# Patient Record
Sex: Male | Born: 1950 | Race: White | Hispanic: No | Marital: Married | Smoking: Never smoker
Health system: Southern US, Community
[De-identification: ages and names within clinical notes are randomized; demographics above are authoritative.]

## PROBLEM LIST (undated history)

## (undated) DIAGNOSIS — I10 Essential (primary) hypertension: Secondary | ICD-10-CM

## (undated) HISTORY — DX: Essential (primary) hypertension: I10

---

## 1998-08-14 HISTORY — PX: HERNIA REPAIR: SHX51

## 2004-08-14 HISTORY — PX: COLONOSCOPY: SHX174

## 2006-05-22 ENCOUNTER — Other Ambulatory Visit: Payer: Self-pay

## 2006-05-22 ENCOUNTER — Emergency Department: Payer: Self-pay

## 2008-04-05 IMAGING — CR DG CHEST 2V
1 series · 2 of 2 positions shown · non-contrast
Comparison: none

REASON FOR EXAM: LEFT arm numbness
COMMENTS:

PROCEDURE:     DXR - DXR CHEST PA (OR AP) AND LATERAL  - May 22, 2006  [DATE]
RESULT:          The lungs are clear.  The cardiac silhouette and visualized
bony skeleton are unremarkable.

[Series 1: view not recorded · 0.17mm/px · 2 of 2 slices shown]
[im 1/2]
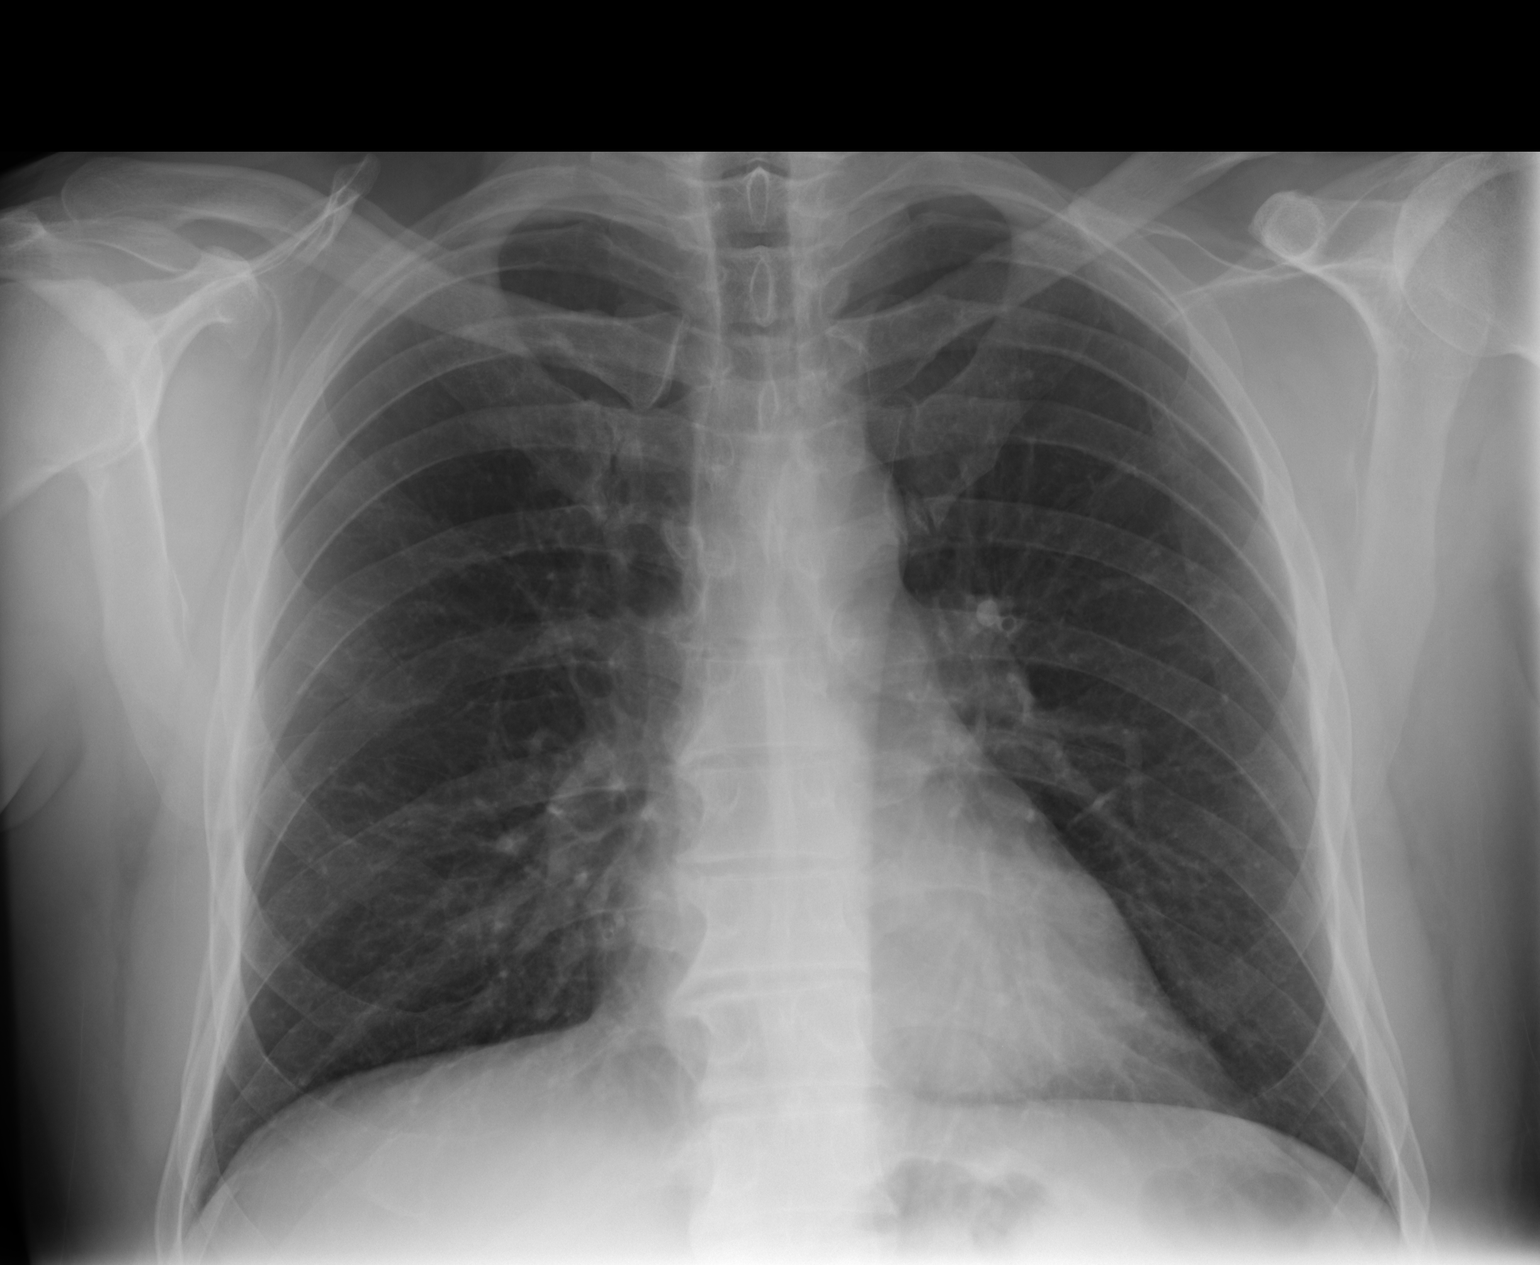
[im 2/2]
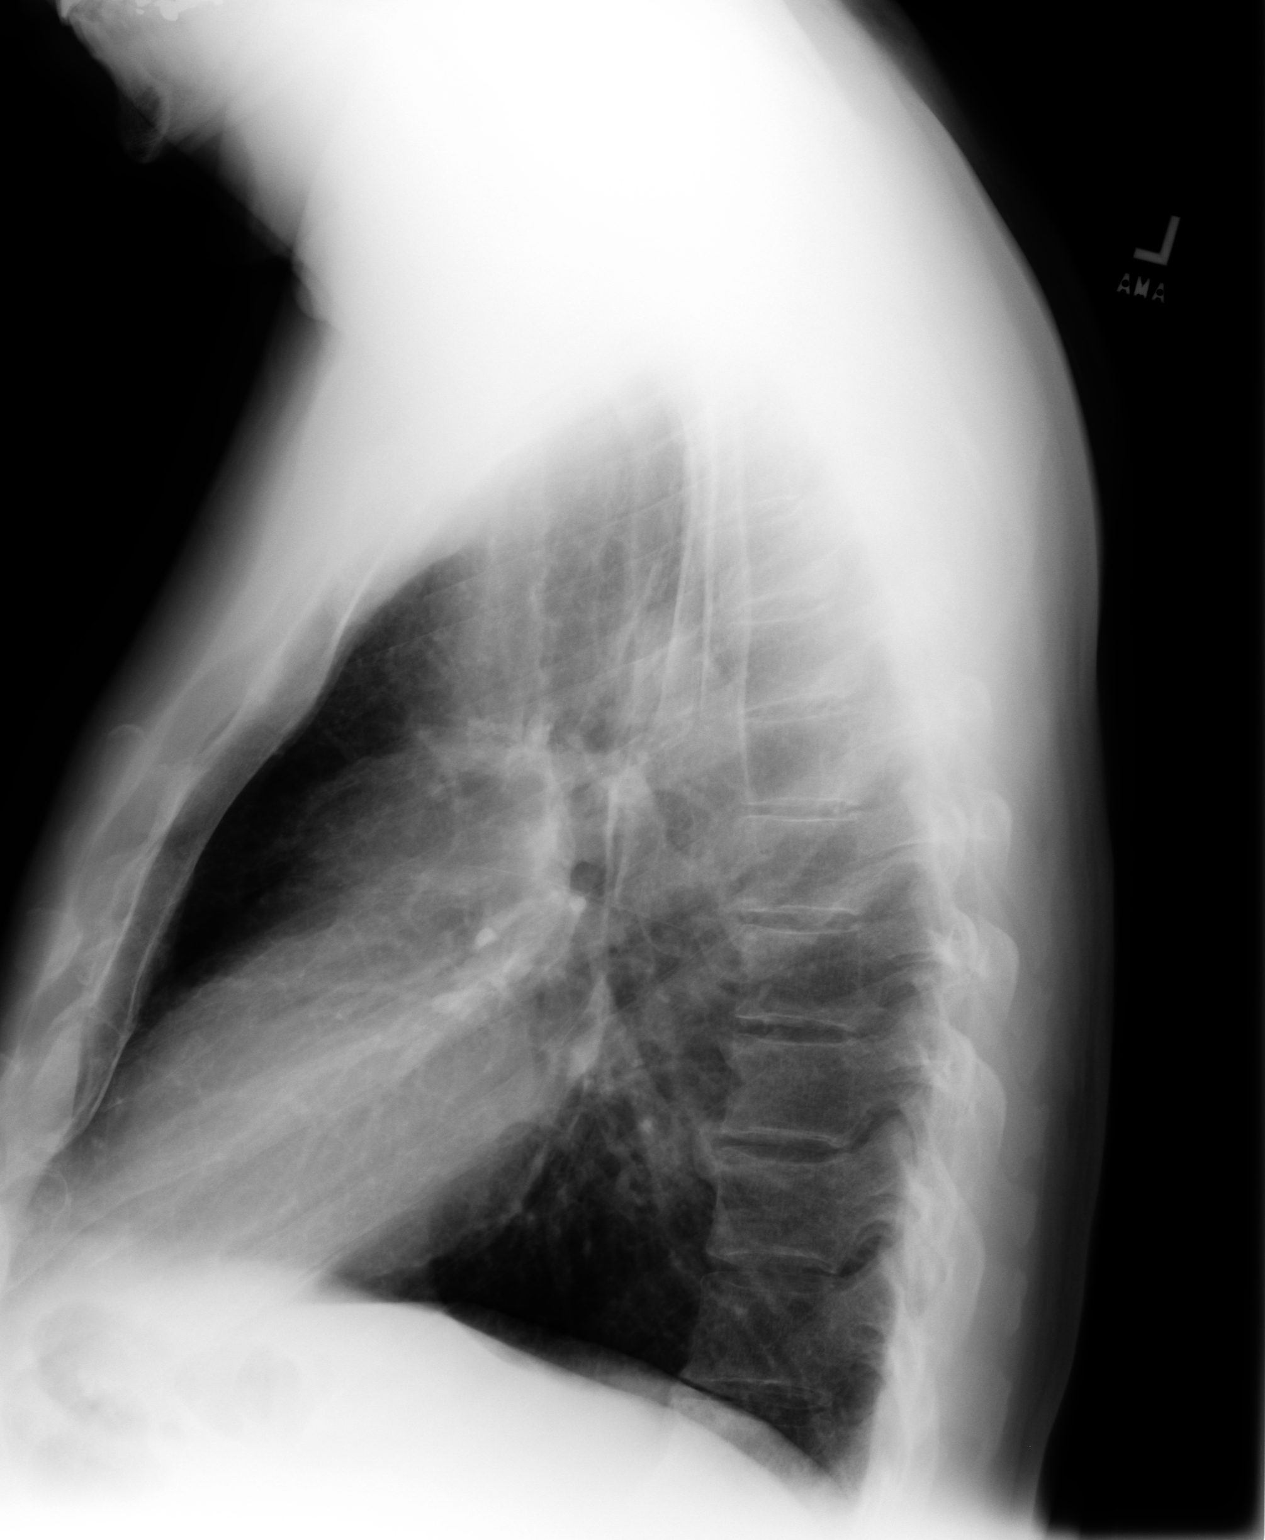

[2 of 2 positions shown; findings below may reference images not displayed]

IMPRESSION: Chest radiograph without evidence of acute cardiopulmonary
disease.

## 2014-02-18 ENCOUNTER — Encounter: Payer: Self-pay | Admitting: *Deleted

## 2014-03-10 ENCOUNTER — Ambulatory Visit (INDEPENDENT_AMBULATORY_CARE_PROVIDER_SITE_OTHER): Payer: BC Managed Care – PPO | Admitting: General Surgery

## 2014-03-10 ENCOUNTER — Encounter: Payer: Self-pay | Admitting: General Surgery

## 2014-03-10 VITALS — BP 180/70 | HR 72 | Resp 12 | Ht 74.0 in | Wt 212.0 lb

## 2014-03-10 DIAGNOSIS — K4090 Unilateral inguinal hernia, without obstruction or gangrene, not specified as recurrent: Secondary | ICD-10-CM

## 2014-03-10 NOTE — Patient Instructions (Addendum)

## 2014-03-10 NOTE — Progress Notes (Signed)
Patient ID: Dillon Coffey., male   DOB: July 19, 1951, 63 y.o.   MRN: [id number]  Chief Complaint  Patient presents with  . Other    left inguinal hernia    HPI Dillon Coffey. is a 63 y.o. male here today for a evaluation of a left inguinal hernia. Patient noticed the area 5 years ago .He states the area painful with active describes it as a dull ache. He had a laparoscopic right inguinal hernia repair done in 1995- 2000 in [place], [place].  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  HPI  Past Medical History  Diagnosis Date  . Hypertension     Past Surgical History  Procedure Laterality Date  . Hernia repair Right 2000    inguinal   . Colonoscopy  2006    No family history on file.  Social History History  Substance Use Topics  . Smoking status: Never Smoker   . Smokeless tobacco: Never Used  . Alcohol Use: No    No Known Allergies  Current Outpatient Prescriptions  Medication Sig Dispense Refill  . amLODipine (NORVASC) 5 MG tablet Take 1 tablet by mouth daily.      Marland Kitchen aspirin 81 MG chewable tablet Chew 81 mg by mouth daily.      Marland Kitchen co-enzyme Q-10 30 MG capsule Take 30 mg by mouth 3 (three) times daily.      . Fish Oil-Cholecalciferol (FISH OIL + D3 PO) Take 1 tablet by mouth daily.      Marland Kitchen lisinopril (PRINIVIL,ZESTRIL) 40 MG tablet Take 1 tablet by mouth daily.       No current facility-administered medications for this visit.    Review of Systems Review of Systems  Constitutional: Negative.   Respiratory: Negative.   Cardiovascular: Negative.   Genitourinary: Negative for decreased urine volume and difficulty urinating.    Blood pressure 180/70, pulse 72, resp. rate 12, height 6\' 2"  (1.88 m), weight 212 lb (96.163 kg).  Physical Exam Physical Exam  Constitutional: He is oriented to person, place, and time. He appears well-developed and well-nourished.  Eyes: Conjunctivae are normal. No scleral icterus.   Neck: Neck supple.  Cardiovascular: Normal rate, regular rhythm and normal heart sounds.   Pulmonary/Chest: Effort normal and breath sounds normal.  Abdominal: Soft. Normal appearance and bowel sounds are normal. There is no hepatomegaly. There is no tenderness. A hernia is present. Hernia confirmed positive in the left inguinal area. Hernia confirmed negative in the right inguinal area.  The hernia is evident in the supine position and reducible with mild pressure.  Genitourinary: Testes normal.  Neurological: He is alert and oriented to person, place, and time.  Skin: Skin is warm and dry.    Data Reviewed PCP notes dated February 18, 2014.  Laboratory studies dated November 21, 2013 sure what blood cell count 6900, hemoglobin 14.6, MCV of 86, platelet count 239,000, normal comprehensive metabolic panel with the exception of a mild solution of the blood sugar 118. Hemoglobin A1c 6.0.  Assessment    Left inguinal hernia.     Plan    Indications for elective repair were reviewed. The patient remains very active, and a mesh repair would be most appropriate. My preference for an anterior approach was discussed and was acceptable to the patient. Anticipated restrictions of activity were reviewed.    Patient is scheduled for surgery at [facility] on 03/24/14. He will pre admit by phone on 03/10/14. Patient  is aware of date and all instructions.  PCP. PCP: Dillon Coffey   Ref.  Dillon Coffey   Dillon Coffey 03/11/2014, 7:07 AM   a

## 2014-03-11 ENCOUNTER — Other Ambulatory Visit: Payer: Self-pay | Admitting: General Surgery

## 2014-03-11 DIAGNOSIS — K4090 Unilateral inguinal hernia, without obstruction or gangrene, not specified as recurrent: Secondary | ICD-10-CM | POA: Insufficient documentation

## 2014-03-24 ENCOUNTER — Ambulatory Visit: Payer: Self-pay | Admitting: General Surgery

## 2014-03-24 DIAGNOSIS — K4090 Unilateral inguinal hernia, without obstruction or gangrene, not specified as recurrent: Secondary | ICD-10-CM

## 2014-03-24 HISTORY — PX: HERNIA REPAIR: SHX51

## 2014-03-25 ENCOUNTER — Encounter: Payer: Self-pay | Admitting: General Surgery

## 2014-04-02 ENCOUNTER — Encounter: Payer: Self-pay | Admitting: General Surgery

## 2014-04-02 ENCOUNTER — Ambulatory Visit (INDEPENDENT_AMBULATORY_CARE_PROVIDER_SITE_OTHER): Payer: Self-pay | Admitting: General Surgery

## 2014-04-02 VITALS — BP 160/68 | HR 80 | Resp 14 | Ht 74.0 in | Wt 211.0 lb

## 2014-04-02 DIAGNOSIS — K4090 Unilateral inguinal hernia, without obstruction or gangrene, not specified as recurrent: Secondary | ICD-10-CM

## 2014-04-02 NOTE — Patient Instructions (Addendum)
Proper lifting techniques reviewed.Patient to return in three weeks.

## 2014-04-02 NOTE — Progress Notes (Signed)
Patient ID: Dillon Wolf., male   DOB: 08-30-1950, 63 y.o.   MRN: [id number]  Chief Complaint  Patient presents with  . Routine Post Op    left inguinal hernia    HPI Dillon Coffey. is a 63 y.o. male here today for his post op left inguinal hernia done on 03/24/14. Patient states he is doing well. The patient reports he required no narcotics post procedure. He is bored to tears at home. He is anxious to resume playing basketball. HPI  Past Medical History  Diagnosis Date  . Hypertension     Past Surgical History  Procedure Laterality Date  . Colonoscopy  2006  . Hernia repair Right 2000    inguinal   . Hernia repair Left 03/24/14    ingunial     No family history on file.  Social History History  Substance Use Topics  . Smoking status: Never Smoker   . Smokeless tobacco: Never Used  . Alcohol Use: No    No Known Allergies  Current Outpatient Prescriptions  Medication Sig Dispense Refill  . amLODipine (NORVASC) 5 MG tablet Take 1 tablet by mouth daily.      Marland Kitchen aspirin 81 MG chewable tablet Chew 81 mg by mouth daily.      Marland Kitchen co-enzyme Q-10 30 MG capsule Take 30 mg by mouth 3 (three) times daily.      . Fish Oil-Cholecalciferol (FISH OIL + D3 PO) Take 1 tablet by mouth daily.      Marland Kitchen lisinopril (PRINIVIL,ZESTRIL) 40 MG tablet Take 1 tablet by mouth daily.       No current facility-administered medications for this visit.    Review of Systems Review of Systems  Constitutional: Negative.   Respiratory: Negative.   Cardiovascular: Negative.     Blood pressure 160/68, pulse 80, resp. rate 14, height 6\' 2"  (1.88 m), weight 211 lb (95.709 kg).  Physical Exam Physical Exam  Constitutional: He is oriented to person, place, and time. He appears well-developed and well-nourished.  Abdominal:  Incision looks clean and healing well. A little bruising noticed  Neurological: He is alert and oriented to person, place, and time.  Skin: Skin is warm and dry.        Assessment    Doing well post left inguinal hernia repair.     Plan    The patient has been asked to refrain from basketball for a few more weeks. He may otherwise increase his activities as tolerated. We'll plan for follow up exam in 3 weeks.     PCP: Daryl Eastern 04/03/2014, 9:24 PM

## 2014-04-22 ENCOUNTER — Ambulatory Visit: Payer: BC Managed Care – PPO | Admitting: General Surgery

## 2014-06-10 ENCOUNTER — Encounter: Payer: Self-pay | Admitting: *Deleted

## 2014-12-05 NOTE — Op Note (Signed)
PATIENT NAME:  Dillon Coffey, Dillon Coffey MR#:  [id number] DATE OF BIRTH:  03-23-1951  DATE OF PROCEDURE:  03/24/2014  PREOPERATIVE DIAGNOSIS: Left inguinal hernia.   POSTOPERATIVE DIAGNOSIS: Left inguinal hernia.    OPERATIVE PROCEDURE:  Left inguinal hernia repair with large Ultrapro mesh.   SURGEON:  Robert Bellow, MD   ANESTHESIA: General by LMA under Dr. Kayleen Memos; Marcaine 0.5% with 1 to 200,000 units epinephrine, 30 mL local infiltration, Toradol 30 mg.   CLINICAL NOTE: This 64 year old male has a long-standing left inguinal hernia that has become increasingly symptomatic making it difficult for him to enjoy his 3 x per week basketball games. He is admitted for elective repair. He received Kefzol prior to the procedure. Hair was removed from the area with clippers.   OPERATIVE NOTE: With the patient under adequate general anesthesia, the groin was prepped with ChloraPrep, and draped. Field block anesthesia was established for postoperative analgesia with Marcaine. A 5 cm skin line incision along the anticipated course of the inguinal canal was carried down through the skin, and subcutaneous tissue with hemostasis achieved by electrocautery, and 3-0 Vicryl ties. The external oblique was opened in the direction of its fibers. The ilioinguinal nerve was identified, and protected. The patient was identified with a large lipoma of the cord that was scarred to the adjacent cord structures. This was taken down with cautery dissection. At the level of the internal ring, the lipoma was ligated with 3-0 Vicryl ties, excised, and discarded. The hernia sac which was indirect in nature was dissected free from adjacent cord structures, and freed into the preperitoneal space. Examination of the medial floor of the inguinal canal showed it to be intact.   A large Ultrapro mesh was smoothed into the preperitoneal space, and the external component laid along the floor of the inguinal canal. This was anchored to the  pubic tubercle with 0 Surgilon. The inferior border was anchored to the inguinal ligament with interrupted 0 Surgilon sutures, and the medial, and superior aspects were anchored to the transverse abdominis aponeurosis with interrupted 0 Surgilon sutures. A lateral slit was made for cord passage. Toradol was placed into the wound for postoperative analgesia. The ilioinguinal nerve was returned to its bed. The external oblique was closed with running 2-0 Vicryl. Scarpa fascia was closed with running 3-0 Vicryl, and the skin closed with a running 4-0 Vicryl subcuticular suture. Benzoin, Steri-Strips, Telfa and Tegaderm dressing was then applied.   The patient tolerated the procedure well and was taken to the recovery room in stable condition.    ____________________________ Robert Bellow, MD jwb:nt D: 03/24/2014 13:14:38 ET T: 03/24/2014 16:08:06 ET JOB#: [id number]  cc: Robert Bellow, MD, <Dictator> Angelina Ok, NP at [place] MD ELECTRONICALLY SIGNED 03/25/2014 7:09

## 2021-02-17 ENCOUNTER — Other Ambulatory Visit: Payer: Self-pay

## 2021-02-17 ENCOUNTER — Ambulatory Visit (INDEPENDENT_AMBULATORY_CARE_PROVIDER_SITE_OTHER): Payer: Medicare Other | Admitting: Dermatology

## 2021-02-17 DIAGNOSIS — D1800 Hemangioma unspecified site: Secondary | ICD-10-CM | POA: Diagnosis not present

## 2021-02-17 DIAGNOSIS — L578 Other skin changes due to chronic exposure to nonionizing radiation: Secondary | ICD-10-CM

## 2021-02-17 DIAGNOSIS — L820 Inflamed seborrheic keratosis: Secondary | ICD-10-CM

## 2021-02-17 DIAGNOSIS — L814 Other melanin hyperpigmentation: Secondary | ICD-10-CM

## 2021-02-17 NOTE — Patient Instructions (Addendum)
If you have any questions or concerns for your doctor, please call our main line at [phone] and press option 4 to reach your doctor's medical assistant. If no one answers, please leave a voicemail as directed and we will return your call as soon as possible. Messages left after 4 pm will be answered the following business day.   You may also send [place] a message via [place]. We typically respond to MyChart messages within 1-2 business days.  For prescription refills, please ask your pharmacy to contact our office. Our fax number is [phone].  If you have an urgent issue when the clinic is closed that cannot wait until the next business day, you can page your doctor at the number below.    Please note that while we do our best to be available for urgent issues outside of office hours, we are not available 24/7.   If you have an urgent issue and are unable to reach [place], you may choose to seek medical care at your doctor's office, retail clinic, urgent care center, or emergency room.  If you have a medical emergency, please immediately call 911 or go to the emergency department.  Pager Numbers  - Dr. Nehemiah Massed: [phone]  - Dr. Laurence Ferrari: [phone]  - Dr. Nicole Kindred: [phone]  In the event of inclement weather, please call our main line at [phone] for an update on the status of any delays or closures.  Dermatology Medication Tips: Please keep the boxes that topical medications come in in order to help keep track of the instructions about where and how to use these. Pharmacies typically print the medication instructions only on the boxes and not directly on the medication tubes.   If your medication is too expensive, please contact our office at [phone] option 4 or send [place] a message through [place].   We are unable to tell what your co-pay for medications will be in advance as this is different depending on your insurance coverage. However, we may be able to find a substitute  medication at lower cost or fill out paperwork to get insurance to cover a needed medication.   If a prior authorization is required to get your medication covered by your insurance company, please allow [place] 1-2 business days to complete this process.  Drug prices often vary depending on where the prescription is filled and some pharmacies may offer cheaper prices.  The website www.goodrx.com contains coupons for medications through different pharmacies. The prices here do not account for what the cost may be with help from insurance (it may be cheaper with your insurance), but the website can give you the price if you did not use any insurance.  - You can print the associated coupon and take it with your prescription to the pharmacy.  - You may also stop by our office during regular business hours and pick up a GoodRx coupon card.  - If you need your prescription sent electronically to a different pharmacy, notify our office through [facility] or by phone at [phone] option 4.   Cryotherapy Aftercare  Wash gently with soap and water everyday.   Apply Vaseline and Band-Aid daily until healed.    Seborrheic Keratosis  What causes seborrheic keratoses? Seborrheic keratoses are harmless, common skin growths that first appear during adult life.  As time goes by, more growths appear.  Some people may develop a large number of them.  Seborrheic keratoses appear on both covered and uncovered body parts.  They are  not caused by sunlight.  The tendency to develop seborrheic keratoses can be inherited.  They vary in color from skin-colored to gray, brown, or even black.  They can be either smooth or have a rough, warty surface.   Seborrheic keratoses are superficial and look as if they were stuck on the skin.  Under the microscope this type of keratosis looks like layers upon layers of skin.  That is why at times the top layer may seem to fall off, but the rest of the growth remains and  re-grows.    Treatment Seborrheic keratoses do not need to be treated, but can easily be removed in the office.  Seborrheic keratoses often cause symptoms when they rub on clothing or jewelry.  Lesions can be in the way of shaving.  If they become inflamed, they can cause itching, soreness, or burning.  Removal of a seborrheic keratosis can be accomplished by freezing, burning, or surgery. If any spot bleeds, scabs, or grows rapidly, please return to have it checked, as these can be an indication of a skin cancer.

## 2021-02-17 NOTE — Progress Notes (Signed)
   New Patient Visit  Subjective  Dillon Coffey. is a 70 y.o. male who presents for the following: check spot (L infra ocular, ~10 yrs, may have grown over years). Has pictures where it is much smaller.   The following portions of the chart were reviewed this encounter and updated as appropriate:        Review of Systems:  No other skin or systemic complaints except as noted in HPI or Assessment and Plan.  Objective  Well appearing patient in no apparent distress; mood and affect are within normal limits.  A focused examination was performed including face, arms. Relevant physical exam findings are noted in the Assessment and Plan.  L infraocular x 1, Total = 1 8.33mm waxy tan pap   Assessment & Plan   Hemangiomas - Red papules - Discussed benign nature - Observe - Call for any changes  Actinic Damage - chronic, secondary to cumulative UV radiation exposure/sun exposure over time - diffuse scaly erythematous macules with underlying dyspigmentation - Recommend daily broad spectrum sunscreen SPF 30+ to sun-exposed areas, reapply every 2 hours as needed.  - Recommend staying in the shade or wearing long sleeves, sun glasses (UVA+UVB protection) and wide brim hats (4-inch brim around the entire circumference of the hat). - Call for new or changing lesions.  Lentigines - Scattered tan macules - Due to sun exposure - Benign-appering, observe - Recommend daily broad spectrum sunscreen SPF 30+ to sun-exposed areas, reapply every 2 hours as needed. - Call for any changes  Inflamed seborrheic keratosis L infraocular x 1, Total = 1  Destruction of lesion - L infraocular x 1, Total = 1  Destruction method: cryotherapy   Informed consent: discussed and consent obtained   Lesion destroyed using liquid nitrogen: Yes   Region frozen until ice ball extended beyond lesion: Yes   Outcome: patient tolerated procedure well with no complications   Post-procedure details: wound care  instructions given   Additional details:  Prior to procedure, discussed risks of blister formation, small wound, skin dyspigmentation, or rare scar following cryotherapy. Recommend Vaseline ointment to treated areas while healing.   Return in about 2 months (around 04/20/2021) for ISK f/u.  I, Othelia Pulling, RMA, am acting as scribe for Brendolyn Patty, MD .  Documentation: I have reviewed the above documentation for accuracy and completeness, and I agree with the above.  Brendolyn Patty MD

## 2021-04-13 ENCOUNTER — Ambulatory Visit (INDEPENDENT_AMBULATORY_CARE_PROVIDER_SITE_OTHER): Payer: Medicare Other | Admitting: Dermatology

## 2021-04-13 ENCOUNTER — Other Ambulatory Visit: Payer: Self-pay

## 2021-04-13 DIAGNOSIS — L570 Actinic keratosis: Secondary | ICD-10-CM

## 2021-04-13 DIAGNOSIS — D1800 Hemangioma unspecified site: Secondary | ICD-10-CM

## 2021-04-13 DIAGNOSIS — Z1283 Encounter for screening for malignant neoplasm of skin: Secondary | ICD-10-CM | POA: Diagnosis not present

## 2021-04-13 DIAGNOSIS — D2239 Melanocytic nevi of other parts of face: Secondary | ICD-10-CM | POA: Diagnosis not present

## 2021-04-13 DIAGNOSIS — L821 Other seborrheic keratosis: Secondary | ICD-10-CM

## 2021-04-13 DIAGNOSIS — L578 Other skin changes due to chronic exposure to nonionizing radiation: Secondary | ICD-10-CM | POA: Diagnosis not present

## 2021-04-13 DIAGNOSIS — L814 Other melanin hyperpigmentation: Secondary | ICD-10-CM

## 2021-04-13 NOTE — Progress Notes (Signed)
   Follow-Up Visit   Subjective  Dillon Coffey. is a 70 y.o. male who presents for the following: Follow-up (Patient here for follow-up ISK of the left infra ocular. He states that area has cleared. Patient requests UBSE today. ).   The following portions of the chart were reviewed this encounter and updated as appropriate:       Review of Systems:  No other skin or systemic complaints except as noted in HPI or Assessment and Plan.  Objective  Well appearing patient in no apparent distress; mood and affect are within normal limits.  All skin waist up examined.  Right Temple Pink brown scaly macule.  Right Nasal Ala 3.60m firm flesh papule   Assessment & Plan   Skin cancer screening performed today.  AK (actinic keratosis) Right Temple  Vs ISK  Actinic keratoses are precancerous spots that appear secondary to cumulative UV radiation exposure/sun exposure over time. They are chronic with expected duration over 1 year. A portion of actinic keratoses will progress to squamous cell carcinoma of the skin. It is not possible to reliably predict which spots will progress to skin cancer and so treatment is recommended to prevent development of skin cancer.  Recommend daily broad spectrum sunscreen SPF 30+ to sun-exposed areas, reapply every 2 hours as needed.  Recommend staying in the shade or wearing long sleeves, sun glasses (UVA+UVB protection) and wide brim hats (4-inch brim around the entire circumference of the hat). Call for new or changing lesions.  Destruction of lesion - Right Temple  Destruction method: cryotherapy   Informed consent: discussed and consent obtained   Lesion destroyed using liquid nitrogen: Yes   Region frozen until ice ball extended beyond lesion: Yes   Outcome: patient tolerated procedure well with no complications   Post-procedure details: wound care instructions given   Additional details:  Prior to procedure, discussed risks of blister  formation, small wound, skin dyspigmentation, or rare scar following cryotherapy. Recommend Vaseline ointment to treated areas while healing.   Fibrous papule of nose Right Nasal Ala  Benign, observe.      Actinic Damage - chronic, secondary to cumulative UV radiation exposure/sun exposure over time - diffuse scaly erythematous macules with underlying dyspigmentation - Recommend daily broad spectrum sunscreen SPF 30+ to sun-exposed areas, reapply every 2 hours as needed.  - Recommend staying in the shade or wearing long sleeves, sun glasses (UVA+UVB protection) and wide brim hats (4-inch brim around the entire circumference of the hat). - Call for new or changing lesions.  Seborrheic Keratoses - Stuck-on, waxy, tan-brown papules and/or plaques; left infra ocular clear  - Benign-appearing - Discussed benign etiology and prognosis. - Observe - Call for any changes  Lentigines - Scattered tan macules - Due to sun exposure - Benign-appering, observe - Recommend daily broad spectrum sunscreen SPF 30+ to sun-exposed areas, reapply every 2 hours as needed. - Call for any changes  Hemangiomas - Red papules - Discussed benign nature - Observe - Call for any changes   Return in about 1 year (around 04/13/2022) for UBSE.  I,Jamesetta Orleans CMA, am acting as scribe for TBrendolyn Patty MD .  Documentation: I have reviewed the above documentation for accuracy and completeness, and I agree with the above.  TBrendolyn PattyMD

## 2021-04-13 NOTE — Patient Instructions (Addendum)
Cryotherapy Aftercare  Wash gently with soap and water everyday.   Apply Vaseline and Band-Aid daily until healed.   Seborrheic Keratosis  What causes seborrheic keratoses? Seborrheic keratoses are harmless, common skin growths that first appear during adult life.  As time goes by, more growths appear.  Some people may develop a large number of them.  Seborrheic keratoses appear on both covered and uncovered body parts.  They are not caused by sunlight.  The tendency to develop seborrheic keratoses can be inherited.  They vary in color from skin-colored to gray, brown, or even black.  They can be either smooth or have a rough, warty surface.   Seborrheic keratoses are superficial and look as if they were stuck on the skin.  Under the microscope this type of keratosis looks like layers upon layers of skin.  That is why at times the top layer may seem to fall off, but the rest of the growth remains and re-grows.    Treatment Seborrheic keratoses do not need to be treated, but can easily be removed in the office.  Seborrheic keratoses often cause symptoms when they rub on clothing or jewelry.  Lesions can be in the way of shaving.  If they become inflamed, they can cause itching, soreness, or burning.  Removal of a seborrheic keratosis can be accomplished by freezing, burning, or surgery. If any spot bleeds, scabs, or grows rapidly, please return to have it checked, as these can be an indication of a skin cancer.   If you have any questions or concerns for your doctor, please call our main line at [phone] and press option 4 to reach your doctor's medical assistant. If no one answers, please leave a voicemail as directed and we will return your call as soon as possible. Messages left after 4 pm will be answered the following business day.   You may also send us a message via MyChart. We typically respond to MyChart messages within 1-2 business days.  For prescription refills, please ask your  pharmacy to contact our office. Our fax number is [phone].  If you have an urgent issue when the clinic is closed that cannot wait until the next business day, you can page your doctor at the number below.    Please note that while we do our best to be available for urgent issues outside of office hours, we are not available 24/7.   If you have an urgent issue and are unable to reach us, you may choose to seek medical care at your doctor's office, retail clinic, urgent care center, or emergency room.  If you have a medical emergency, please immediately call 911 or go to the emergency department.  Pager Numbers  - Dr. Kowalski: [phone]  - Dr. Moye: [phone]  - Dr. Stewart: [phone]  In the event of inclement weather, please call our main line at [phone] for an update on the status of any delays or closures.  Dermatology Medication Tips: Please keep the boxes that topical medications come in in order to help keep track of the instructions about where and how to use these. Pharmacies typically print the medication instructions only on the boxes and not directly on the medication tubes.   If your medication is too expensive, please contact our office at [phone] option 4 or send us a message through MyChart.   We are unable to tell what your co-pay for medications will be in advance as this is different depending on your insurance coverage.   However, we may be able to find a substitute medication at lower cost or fill out paperwork to get insurance to cover a needed medication.   If a prior authorization is required to get your medication covered by your insurance company, please allow us 1-2 business days to complete this process.  Drug prices often vary depending on where the prescription is filled and some pharmacies may offer cheaper prices.  The website www.goodrx.com contains coupons for medications through different pharmacies. The prices here do not  account for what the cost may be with help from insurance (it may be cheaper with your insurance), but the website can give you the price if you did not use any insurance.  - You can print the associated coupon and take it with your prescription to the pharmacy.  - You may also stop by our office during regular business hours and pick up a GoodRx coupon card.  - If you need your prescription sent electronically to a different pharmacy, notify our office through [facility] MyChart or by phone at [phone] option 4.  

## 2021-07-26 ENCOUNTER — Ambulatory Visit: Payer: Self-pay | Admitting: Dermatology

## 2022-04-18 ENCOUNTER — Ambulatory Visit: Payer: Medicare Other | Admitting: Dermatology
